# Patient Record
Sex: Male | Born: 1955 | Race: Asian | Hispanic: No | Marital: Married
Health system: Southern US, Community
[De-identification: ages and names within clinical notes are randomized; demographics above are authoritative.]

## PROBLEM LIST (undated history)

## (undated) DIAGNOSIS — I10 Essential (primary) hypertension: Secondary | ICD-10-CM

---

## 2020-01-02 ENCOUNTER — Emergency Department (HOSPITAL_COMMUNITY): Payer: Medicaid - Out of State

## 2020-01-02 ENCOUNTER — Other Ambulatory Visit: Payer: Self-pay

## 2020-01-02 ENCOUNTER — Encounter (HOSPITAL_COMMUNITY): Payer: Self-pay | Admitting: Emergency Medicine

## 2020-01-02 ENCOUNTER — Emergency Department (HOSPITAL_COMMUNITY)
Admission: EM | Admit: 2020-01-02 | Discharge: 2020-01-02 | Disposition: A | Discharge status: Home / Self Care | Payer: Medicaid - Out of State | Attending: Emergency Medicine | Admitting: Emergency Medicine

## 2020-01-02 DIAGNOSIS — Y929 Unspecified place or not applicable: Secondary | ICD-10-CM | POA: Insufficient documentation

## 2020-01-02 DIAGNOSIS — R42 Dizziness and giddiness: Secondary | ICD-10-CM | POA: Insufficient documentation

## 2020-01-02 DIAGNOSIS — W19XXXA Unspecified fall, initial encounter: Secondary | ICD-10-CM | POA: Diagnosis not present

## 2020-01-02 DIAGNOSIS — I10 Essential (primary) hypertension: Secondary | ICD-10-CM | POA: Insufficient documentation

## 2020-01-02 DIAGNOSIS — Y999 Unspecified external cause status: Secondary | ICD-10-CM | POA: Diagnosis not present

## 2020-01-02 DIAGNOSIS — H81399 Other peripheral vertigo, unspecified ear: Secondary | ICD-10-CM

## 2020-01-02 DIAGNOSIS — Y939 Activity, unspecified: Secondary | ICD-10-CM | POA: Insufficient documentation

## 2020-01-02 DIAGNOSIS — G6289 Other specified polyneuropathies: Secondary | ICD-10-CM

## 2020-01-02 HISTORY — DX: Essential (primary) hypertension: I10

## 2020-01-02 LAB — BASIC METABOLIC PANEL
Anion gap: 10 (ref 5–15)
BUN: 20 mg/dL (ref 8–23)
CO2: 25 mmol/L (ref 22–32)
Calcium: 9.5 mg/dL (ref 8.9–10.3)
Chloride: 103 mmol/L (ref 98–111)
Creatinine, Ser: 1.4 mg/dL — ABNORMAL HIGH (ref 0.61–1.24)
GFR calc Af Amer: >60 mL/min (ref >60)
GFR calc non Af Amer: 53 mL/min — ABNORMAL LOW (ref >60)
Glucose, Bld: 125 mg/dL — ABNORMAL HIGH (ref 70–99)
Potassium: 4.2 mmol/L (ref 3.5–5.1)
Sodium: 138 mmol/L (ref 135–145)

## 2020-01-02 LAB — CBC WITH DIFFERENTIAL/PLATELET
Abs Immature Granulocytes: 0.03 10*3/uL (ref 0.00–0.07)
Basophils Absolute: 0 10*3/uL (ref 0.0–0.1)
Basophils Relative: 0 %
Eosinophils Absolute: 0 10*3/uL (ref 0.0–0.5)
Eosinophils Relative: 0 %
HCT: 46.7 % (ref 39.0–52.0)
Hemoglobin: 15.9 g/dL (ref 13.0–17.0)
Immature Granulocytes: 0 %
Lymphocytes Relative: 10 %
Lymphs Abs: 1.1 10*3/uL (ref 0.7–4.0)
MCH: 31.2 pg (ref 26.0–34.0)
MCHC: 34 g/dL (ref 30.0–36.0)
MCV: 91.7 fL (ref 80.0–100.0)
Monocytes Absolute: 0.5 10*3/uL (ref 0.1–1.0)
Monocytes Relative: 5 %
Neutro Abs: 9 10*3/uL — ABNORMAL HIGH (ref 1.7–7.7)
Neutrophils Relative %: 85 %
Platelets: 185 10*3/uL (ref 150–400)
RBC: 5.09 MIL/uL (ref 4.22–5.81)
RDW: 12 % (ref 11.5–15.5)
WBC: 10.6 10*3/uL — ABNORMAL HIGH (ref 4.0–10.5)
nRBC: 0 % (ref 0.0–0.2)

## 2020-01-02 MED ORDER — FENTANYL CITRATE (PF) 100 MCG/2ML IJ SOLN
50.0000 ug | Freq: Once | INTRAMUSCULAR | Status: AC
  Administered 2020-01-02: 50 ug via INTRAVENOUS
  Filled 2020-01-02: qty 2

## 2020-01-02 MED ORDER — DIAZEPAM 5 MG PO TABS: 5.0000 mg | ORAL_TABLET | Freq: Three times a day (TID) | ORAL | 0 refills | Status: AC | PRN

## 2020-01-02 MED ORDER — ONDANSETRON HCL 4 MG/2ML IJ SOLN
4.0000 mg | Freq: Once | INTRAMUSCULAR | Status: AC
  Administered 2020-01-02: 4 mg via INTRAVENOUS
  Filled 2020-01-02: qty 2

## 2020-01-02 MED ORDER — LORAZEPAM 2 MG/ML IJ SOLN
1.0000 mg | Freq: Once | INTRAMUSCULAR | Status: AC
  Administered 2020-01-02: 1 mg via INTRAVENOUS
  Filled 2020-01-02: qty 1

## 2020-01-02 MED ORDER — MECLIZINE HCL 25 MG PO TABS
50.0000 mg | ORAL_TABLET | Freq: Once | ORAL | Status: AC
  Administered 2020-01-02: 50 mg via ORAL
  Filled 2020-01-02: qty 2

## 2020-01-02 MED ORDER — IOHEXOL 350 MG/ML SOLN
75.0000 mL | Freq: Once | INTRAVENOUS | Status: AC | PRN
  Administered 2020-01-02: 75 mL via INTRAVENOUS

## 2020-01-02 MED ORDER — DIAZEPAM 5 MG PO TABS
5.0000 mg | ORAL_TABLET | Freq: Once | ORAL | Status: AC
  Administered 2020-01-02: 5 mg via ORAL
  Filled 2020-01-02: qty 1

## 2020-01-02 NOTE — ED Notes (Signed)
Pt transported to CT ?

## 2020-01-02 NOTE — ED Provider Notes (Signed)
[facility]PART[facility] Provider Note   CSN: [id number] Arrival date & time: 01/02/20  0115     History Chief Complaint  Patient presents with  . Fall    [address] [place] is a 64 y.o. male.  HPI    64 year old male comes in a chief complaint of fall. Patient has history of hypertension.  He reports that he had woken up in the middle night to go to the bathroom, got dizzy and fell down.  Patient went down about 20 stairs.  Immediately after the fall he could not move his right side.  Subsequently he was able to move his right side but he started having numbness and tingling in his bilateral upper extremities.  Patient is complaining of headache, neck pain, right shoulder pain.  He denies any chest pain, shortness of breath or abdominal pain.  Patient is not on any blood thinners.  He denies any weakness in his lower extremity or upper extremities.  Past Medical History:  Diagnosis Date  . Hypertension     There are no problems to display for this patient.    No family history on file.  Social History   Tobacco Use  . Smoking status: Not on file  Substance Use Topics  . Alcohol use: Not on file  . Drug use: Not on file    Home Medications Prior to Admission medications   Medication Sig Start Date End Date Taking? Authorizing Provider  diazepam (VALIUM) 5 MG tablet Take 1 tablet (5 mg total) by mouth every 8 (eight) hours as needed (dizziness). 01/02/20   Blanchie Dessert, MD    Allergies    Patient has no known allergies.  Review of Systems   Review of Systems  Constitutional: Positive for activity change.  Respiratory: Negative for shortness of breath.   Cardiovascular: Negative for chest pain.  Gastrointestinal: Positive for nausea. Negative for vomiting.  Neurological: Positive for dizziness and numbness.  Hematological: Does not bruise/bleed easily.  All other systems reviewed and are negative.   Physical Exam Updated Vital  Signs BP 115/87   Pulse 60   Temp 97.8 F (36.6 C) (Oral)   Resp 20   SpO2 100%   Physical Exam Vitals and nursing note reviewed.  Constitutional:      Appearance: He is well-developed.  HENT:     Head: Atraumatic.  Eyes:     Extraocular Movements: Extraocular movements intact.     Pupils: Pupils are equal, round, and reactive to light.  Neck:     Comments: Patient has tenderness over the C-spine. Cardiovascular:     Rate and Rhythm: Normal rate.  Pulmonary:     Effort: Pulmonary effort is normal.  Musculoskeletal:     Cervical back: Neck supple.  Skin:    General: Skin is warm.  Neurological:     Mental Status: He is alert and oriented to person, place, and time.     Comments: Subjective numbness over the bilateral upper extremities. Patient has 1+ bicipital and patellar reflex bilaterally. Gross sensory exam of the lower extremities normal. Strength for upper and lower extremities 4+ out of 5 and equal bilaterally. No nystagmus.     ED Results / Procedures / Treatments   Labs (all labs ordered are listed, but only abnormal results are displayed) Labs Reviewed  BASIC METABOLIC PANEL - Abnormal; Notable for the following components:      Result Value   Glucose, Bld 125 (*)    Creatinine, Ser  1.40 (*)    GFR calc non Af Amer 53 (*)    All other components within normal limits  CBC WITH DIFFERENTIAL/PLATELET - Abnormal; Notable for the following components:   WBC 10.6 (*)    Neutro Abs 9.0 (*)    All other components within normal limits    EKG None  Radiology CT Angio Head W or Wo Contrast  Result Date: 01/02/2020 CLINICAL DATA:  Peripheral vertigo.  Fell down stairs. EXAM: CT ANGIOGRAPHY HEAD AND NECK TECHNIQUE: Multidetector CT imaging of the head and neck was performed using the standard protocol during bolus administration of intravenous contrast. Multiplanar CT image reconstructions and MIPs were obtained to evaluate the vascular anatomy. Carotid  stenosis measurements (when applicable) are obtained utilizing NASCET criteria, using the distal internal carotid diameter as the denominator. CONTRAST:  44mL OMNIPAQUE IOHEXOL 350 MG/ML SOLN COMPARISON:  CT head 01/02/2020. FINDINGS: CTA NECK FINDINGS Aortic arch: Standard branching. Imaged portion shows no evidence of aneurysm or dissection. No significant stenosis of the major arch vessel origins. Right carotid system: Normal right carotid. Negative for stenosis or dissection Left carotid system: Normal left carotid. Negative for stenosis or dissection Vertebral arteries: Vertebral arteries are codominant and normal bilaterally. Skeleton: Mild cervical spondylosis. No acute skeletal abnormality. Periapical lucency around right upper molar. Other neck: Negative for mass or adenopathy. Upper chest: Mild scarring left upper lobe. Remaining lung apices clear. Review of the MIP images confirms the above findings CTA HEAD FINDINGS Anterior circulation: Cavernous carotid widely patent bilaterally. Anterior and middle cerebral arteries patent bilaterally without stenosis. No large vessel occlusion or aneurysm. Posterior circulation: Both vertebral arteries widely patent to the basilar. Left PICA patent. Right PICA not visualized. Bilateral AICA patent. Basilar widely patent. Superior cerebellar and posterior cerebral arteries widely patent without stenosis or large vessel occlusion. Venous sinuses: Normal venous enhancement. Anatomic variants: None Review of the MIP images confirms the above findings IMPRESSION: 1. Normal CTA head and neck.  No stenosis or occlusion. Electronically Signed   By: Marlan Palau M.D.   On: 01/02/2020 09:43   DG Chest 2 View  Result Date: 01/02/2020 CLINICAL DATA:  Pt c/o head pain and arm "pinching." pt reports falling down more than 20 stairs. Pt reports slip and fall. No blood thinners, +LOC EXAM: CHEST - 2 VIEW COMPARISON:  None. FINDINGS: The cardiac silhouette is normal in size. No  mediastinal or hilar masses. No evidence of adenopathy. Clear lungs.  No pleural effusion or pneumothorax. Skeletal structures are intact. IMPRESSION: No active cardiopulmonary disease. Electronically Signed   By: Amie Portland M.D.   On: 01/02/2020 08:07   DG Shoulder Right  Result Date: 01/02/2020 CLINICAL DATA:  Pt c/o head pain and arm "pinching." pt reports falling down more than 20 stairs. Pt reports slip and fall. No blood thinners, +LOC EXAM: RIGHT SHOULDER - 2+ VIEW COMPARISON:  None. FINDINGS: No fracture or bone lesion. Mild narrowing of the glenohumeral and AC joints with small marginal osteophytes. Small focus of calcification adjacent to the greater tuberosity of the proximal humerus consistent with rotator cuff calcific tendinitis. Soft tissues are otherwise unremarkable. IMPRESSION: 1. No fracture or acute finding. 2. Mild degenerative/arthropathic changes of the glenohumeral and AC joints. Evidence of rotator cuff calcific tendinitis. Electronically Signed   By: Amie Portland M.D.   On: 01/02/2020 08:06   CT Head Wo Contrast  Result Date: 01/02/2020 CLINICAL DATA:  Fall down 20+ stairs, no blood thinners, reported loss of consciousness, right scalp  abrasions EXAM: CT HEAD WITHOUT CONTRAST CT CERVICAL SPINE WITHOUT CONTRAST TECHNIQUE: Multidetector CT imaging of the head and cervical spine was performed following the standard protocol without intravenous contrast. Multiplanar CT image reconstructions of the cervical spine were also generated. COMPARISON:  None. FINDINGS: CT HEAD FINDINGS Brain: No evidence of acute infarction, hemorrhage, hydrocephalus, extra-axial collection or mass lesion/mass effect. Symmetric prominence of the ventricles, cisterns and sulci compatible with parenchymal volume loss. Patchy areas of white matter hypoattenuation are most compatible with chronic microvascular angiopathy. Vascular: Atherosclerotic calcification of the carotid siphons. No hyperdense vessel.  Skull: Right parietal scalp swelling and hematoma measuring up to 7 mm in maximal thickness. No subjacent calvarial fracture. No soft tissue gas or foreign body. No other significant scalp sites of scalp swelling or thickening. No worrisome or suspicious osseous lesions. Sinuses/Orbits: Hyperostotic changes of the right maxillary sinus with some mild thickening likely reflecting chronic sinusitis. Remaining paranasal sinuses are predominantly clear. Few opacified left mastoid air cells posteriorly also with hyperostosis suggesting chronicity. No right mastoid effusions. Middle ear cavities are clear. Included orbital structures are unremarkable. Other: None CT CERVICAL SPINE FINDINGS Alignment: Stabilization collar is absent at the time of examination. There is mild straightening of the normal cervical lordosis with slight reversal at the upper lobe cervical levels apex C3-4. No evidence of traumatic listhesis. No abnormally widened, perched or jumped facets. Normal alignment of the craniocervical and atlantoaxial articulations accounting for mild rightward cranial rotation. Skull base and vertebrae: No visible skull base fracture. No vertebral body fracture or height loss is seen. Arthrosis at the atlantodental interval and basion dens interval. Mild diffuse spondylitic changes with uncinate spurring and mild facet hypertrophy. Bone island in the posterior right first rib. No acute or worrisome osseous lesions. Soft tissues and spinal canal: No pre or paravertebral fluid or swelling. No visible canal hematoma. Few calcified tonsilloliths. Disc levels: Multilevel cervical spondylitic changes, as detailed above. Posterior disc osteophyte complexes are noted throughout the cervical spine largely eccentric to the left central/subarticular zones. Multilevel mild canal stenoses are present C2-C7. Uncinate spurring and facet degenerative changes also result in multilevel mild-to-moderate foraminal narrowing with more  severe narrowing on the left at C3-4. Upper chest: No acute abnormality in the upper chest or imaged lung apices. Other: No concerning thyroid nodules. Periapical lucencies in the visible dentition. IMPRESSION: 1. No acute intracranial abnormality. 2. Right parietal scalp swelling and hematoma measuring up to 7 mm in maximal thickness. No subjacent calvarial fracture. 3. Chronic right maxillary sinusitis and left mastoid effusion. 4. No acute cervical spine fracture or traumatic listhesis. 5. Multilevel cervical spondylitic and facet degenerative changes, as detailed above. 6. Periapical lucency of the visible dentition. Correlate with dental exam. Electronically Signed   By: Kreg Shropshire M.D.   On: 01/02/2020 03:42   CT Angio Neck W and/or Wo Contrast  Result Date: 01/02/2020 CLINICAL DATA:  Peripheral vertigo.  Fell down stairs. EXAM: CT ANGIOGRAPHY HEAD AND NECK TECHNIQUE: Multidetector CT imaging of the head and neck was performed using the standard protocol during bolus administration of intravenous contrast. Multiplanar CT image reconstructions and MIPs were obtained to evaluate the vascular anatomy. Carotid stenosis measurements (when applicable) are obtained utilizing NASCET criteria, using the distal internal carotid diameter as the denominator. CONTRAST:  73mL OMNIPAQUE IOHEXOL 350 MG/ML SOLN COMPARISON:  CT head 01/02/2020. FINDINGS: CTA NECK FINDINGS Aortic arch: Standard branching. Imaged portion shows no evidence of aneurysm or dissection. No significant stenosis of the major arch vessel  origins. Right carotid system: Normal right carotid. Negative for stenosis or dissection Left carotid system: Normal left carotid. Negative for stenosis or dissection Vertebral arteries: Vertebral arteries are codominant and normal bilaterally. Skeleton: Mild cervical spondylosis. No acute skeletal abnormality. Periapical lucency around right upper molar. Other neck: Negative for mass or adenopathy. Upper chest:  Mild scarring left upper lobe. Remaining lung apices clear. Review of the MIP images confirms the above findings CTA HEAD FINDINGS Anterior circulation: Cavernous carotid widely patent bilaterally. Anterior and middle cerebral arteries patent bilaterally without stenosis. No large vessel occlusion or aneurysm. Posterior circulation: Both vertebral arteries widely patent to the basilar. Left PICA patent. Right PICA not visualized. Bilateral AICA patent. Basilar widely patent. Superior cerebellar and posterior cerebral arteries widely patent without stenosis or large vessel occlusion. Venous sinuses: Normal venous enhancement. Anatomic variants: None Review of the MIP images confirms the above findings IMPRESSION: 1. Normal CTA head and neck.  No stenosis or occlusion. Electronically Signed   By: Marlan Palau M.D.   On: 01/02/2020 09:43   CT Cervical Spine Wo Contrast  Result Date: 01/02/2020 CLINICAL DATA:  Fall down 20+ stairs, no blood thinners, reported loss of consciousness, right scalp abrasions EXAM: CT HEAD WITHOUT CONTRAST CT CERVICAL SPINE WITHOUT CONTRAST TECHNIQUE: Multidetector CT imaging of the head and cervical spine was performed following the standard protocol without intravenous contrast. Multiplanar CT image reconstructions of the cervical spine were also generated. COMPARISON:  None. FINDINGS: CT HEAD FINDINGS Brain: No evidence of acute infarction, hemorrhage, hydrocephalus, extra-axial collection or mass lesion/mass effect. Symmetric prominence of the ventricles, cisterns and sulci compatible with parenchymal volume loss. Patchy areas of white matter hypoattenuation are most compatible with chronic microvascular angiopathy. Vascular: Atherosclerotic calcification of the carotid siphons. No hyperdense vessel. Skull: Right parietal scalp swelling and hematoma measuring up to 7 mm in maximal thickness. No subjacent calvarial fracture. No soft tissue gas or foreign body. No other significant  scalp sites of scalp swelling or thickening. No worrisome or suspicious osseous lesions. Sinuses/Orbits: Hyperostotic changes of the right maxillary sinus with some mild thickening likely reflecting chronic sinusitis. Remaining paranasal sinuses are predominantly clear. Few opacified left mastoid air cells posteriorly also with hyperostosis suggesting chronicity. No right mastoid effusions. Middle ear cavities are clear. Included orbital structures are unremarkable. Other: None CT CERVICAL SPINE FINDINGS Alignment: Stabilization collar is absent at the time of examination. There is mild straightening of the normal cervical lordosis with slight reversal at the upper lobe cervical levels apex C3-4. No evidence of traumatic listhesis. No abnormally widened, perched or jumped facets. Normal alignment of the craniocervical and atlantoaxial articulations accounting for mild rightward cranial rotation. Skull base and vertebrae: No visible skull base fracture. No vertebral body fracture or height loss is seen. Arthrosis at the atlantodental interval and basion dens interval. Mild diffuse spondylitic changes with uncinate spurring and mild facet hypertrophy. Bone island in the posterior right first rib. No acute or worrisome osseous lesions. Soft tissues and spinal canal: No pre or paravertebral fluid or swelling. No visible canal hematoma. Few calcified tonsilloliths. Disc levels: Multilevel cervical spondylitic changes, as detailed above. Posterior disc osteophyte complexes are noted throughout the cervical spine largely eccentric to the left central/subarticular zones. Multilevel mild canal stenoses are present C2-C7. Uncinate spurring and facet degenerative changes also result in multilevel mild-to-moderate foraminal narrowing with more severe narrowing on the left at C3-4. Upper chest: No acute abnormality in the upper chest or imaged lung apices. Other: No concerning thyroid  nodules. Periapical lucencies in the visible  dentition. IMPRESSION: 1. No acute intracranial abnormality. 2. Right parietal scalp swelling and hematoma measuring up to 7 mm in maximal thickness. No subjacent calvarial fracture. 3. Chronic right maxillary sinusitis and left mastoid effusion. 4. No acute cervical spine fracture or traumatic listhesis. 5. Multilevel cervical spondylitic and facet degenerative changes, as detailed above. 6. Periapical lucency of the visible dentition. Correlate with dental exam. Electronically Signed   By: Kreg ShropshirePrice  DeHay M.D.   On: 01/02/2020 03:42    Procedures Procedures (including critical care time)  Medications Ordered in ED Medications  meclizine (ANTIVERT) tablet 50 mg (50 mg Oral Given 01/02/20 0717)  LORazepam (ATIVAN) injection 1 mg (1 mg Intravenous Given 01/02/20 0720)  ondansetron (ZOFRAN) injection 4 mg (4 mg Intravenous Given 01/02/20 0718)  fentaNYL (SUBLIMAZE) injection 50 mcg (50 mcg Intravenous Given 01/02/20 0723)  diazepam (VALIUM) tablet 5 mg (5 mg Oral Given 01/02/20 0818)  iohexol (OMNIPAQUE) 350 MG/ML injection 75 mL (75 mLs Intravenous Contrast Given 01/02/20 0901)    ED Course  I have reviewed the triage vital signs and the nursing notes.  Pertinent labs & imaging results that were available during my care of the patient were reviewed by me and considered in my medical decision making (see chart for details).    MDM Rules/Calculators/A&P                          64 year old male comes in a chief complaint of fall. He is hemodynamically stable.  He had gone down about 20 stairs.  Patient is not on any blood thinners.  He did have neurologic symptoms of bilateral upper extremity numbness, which started after a brief episode where patient could not move his right upper extremity.  His strength is now back to baseline.  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  Clinically does not appear that there is cord compression  based on exam.  CT head and C-spine ordered and they are reassuring.  C-spine CT scan is showing multiple areas of arthropathy and bone spurs, they are likely contributing to the symptoms he is having.  Patient will get neurosurgery follow-up in case his symptoms are not getting better.  We discussed strict ER return precautions and will return to the ER if he starts having worsening weakness, numbness.  Additionally patient is complaining of spinning sensation.  This also started after he had the fall.  Patient symptoms are worse with any kind of movement.  He has associated nausea.  CT head is negative.  Neuro exam is revealing no dysmetria or nystagmus.  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  We will give patient Ativan and meclizine.  Dr. Anitra LauthPlunkett to take over care at this time.  If patient is not getting better, then we will get a CT angiogram head and neck.  Final Clinical Impression(s) / ED Diagnoses Final diagnoses:  Fall, initial encounter  Peripheral vertigo, unspecified laterality    Rx / DC Orders ED Discharge Orders         Ordered    diazepam (VALIUM) 5 MG tablet  Every 8 hours PRN     Discontinue  Reprint     01/02/20 1043           Derwood KaplanNanavati, Kennette Cuthrell, MD 01/02/20 2311

## 2020-01-02 NOTE — Discharge Instructions (Signed)
All the imaging today shows no sign of internal injury from your fall.  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  You may have dizziness for the next few days and you can take the medication as needed but you do not have to take it regularly if you are not feeling dizzy.  Make sure if you are feeling unsteady walking someone is there to help you to ensure you do not fall.

## 2020-01-02 NOTE — ED Triage Notes (Addendum)
Pt presents to ED POV. Pt c/o head pain and arm "pinching." pt reports falling down more than 20 stairs. Pt reports slip and fall. No blood thinners, +LOC. Pt reports he was unable to move initially for 40m. Abrasion on R head.

## 2020-01-02 NOTE — ED Provider Notes (Signed)
Assumed care of patient from Dr. Rhunette Croft at 7:30 AM.  Patient had received meclizine and reported his dizziness was significantly better however when attempted to ambulate the patient he became extremely dizzy was slightly ataxic and having to hold onto the wall not to fall.  Given patient's recent fall down the stairs with the neck pain will ensure there is no vertebral dissection.  If that is normal and patient is still having dizziness with the addition of Valium he will need admission for symptom control.  10:10 AM CTA without evidence of vertebral dissection.  After Valium patient is now walking and feeling much better.  Feel it is reasonable that patient go home and given a prescription for Valium to take as needed.  We will have him follow-up with his doctor if dizziness does not resolve.   Gwyneth Sprout, MD 01/02/20 1043

## 2020-01-02 NOTE — ED Notes (Signed)
Patient verbalizes understanding of discharge instructions. Opportunity for questioning and answers were provided. Pt discharged from ED. 

## 2020-01-02 NOTE — ED Notes (Signed)
Dr. Clayborne Dana given verbal for CT head and c-spine

## 2020-01-02 NOTE — ED Notes (Signed)
Attempted to ambulate pt in hallway; pt ataxic, c/o dizziness; pt assisted back to bed; Dr. Anitra Lauth notified

## 2020-01-02 NOTE — ED Notes (Addendum)
Pt ambulatory in hall with steady gait; denies dizziness; Dr. Anitra Lauth aware

## 2021-05-15 IMAGING — CT CT CERVICAL SPINE W/O CM
3 of 4 series · 12 of 33 positions shown, 14 images · non-contrast
Comparison: None.

CLINICAL DATA: Fall down 20+ stairs, no blood thinners, reported
loss of consciousness, right scalp abrasions

EXAM:
CT HEAD WITHOUT CONTRAST
CT CERVICAL SPINE WITHOUT CONTRAST
TECHNIQUE: Multidetector CT imaging of the head and cervical spine was
performed following the standard protocol without intravenous
contrast. Multiplanar CT image reconstructions of the cervical spine
were also generated.

[Series 8: sag bone · sagittal · 0.30mm/px · 5 of 79 slices shown, 6 images]
[im 27/79  bone]
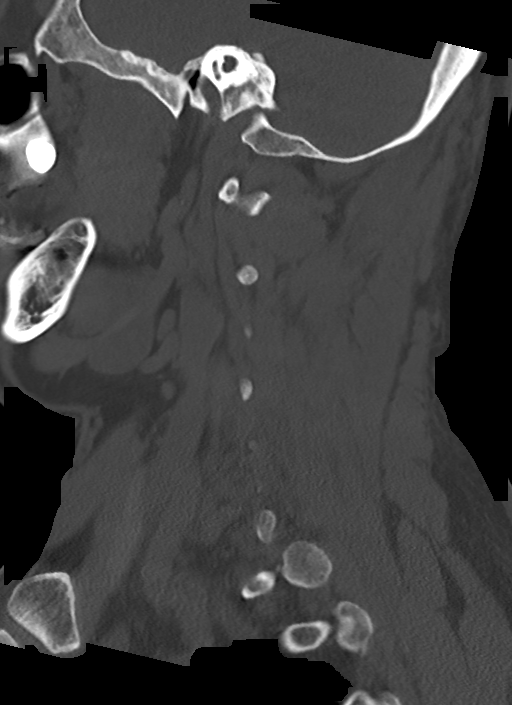
[im 33/79  bone]
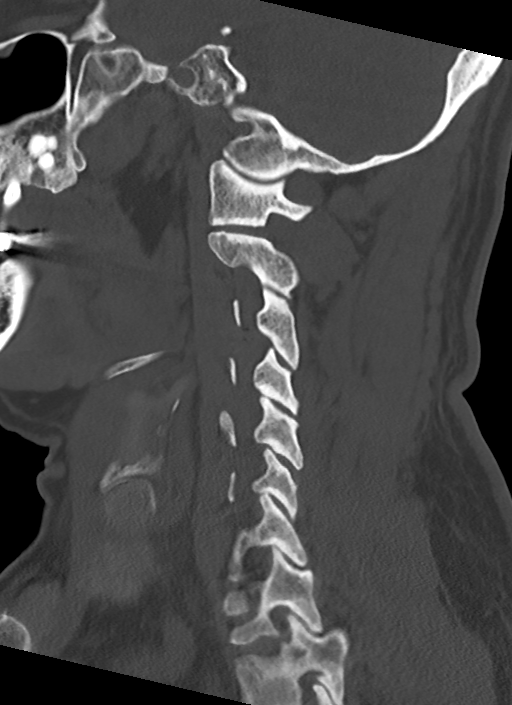
[im 40/79  soft-tissue]
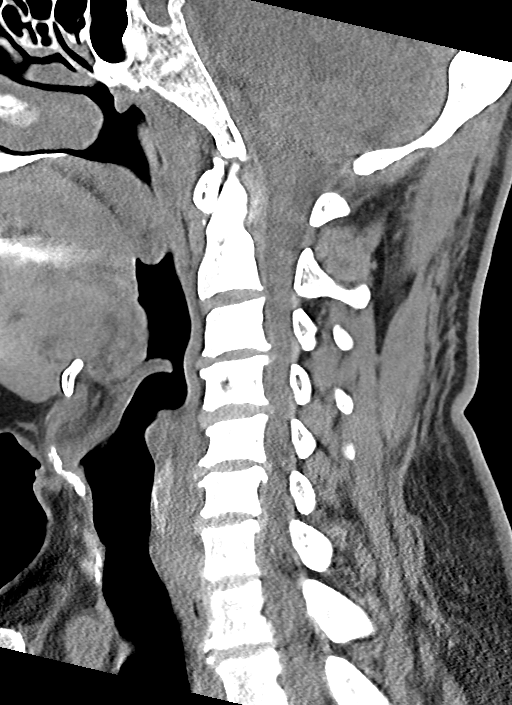
[im 40/79  bone]
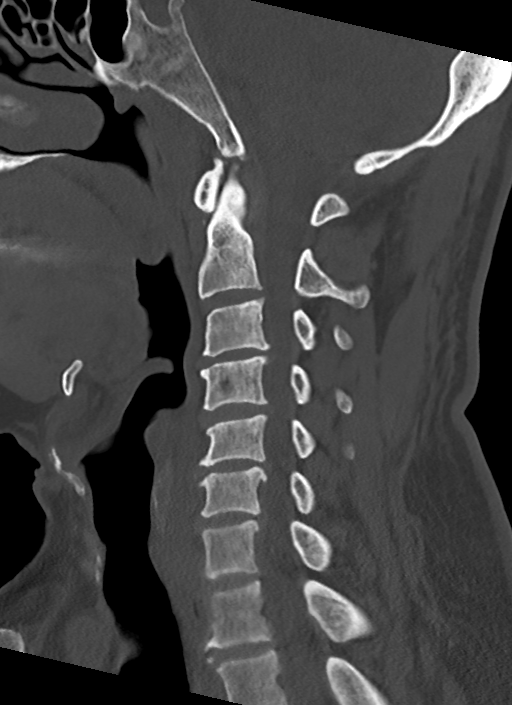
[im 46/79  bone]
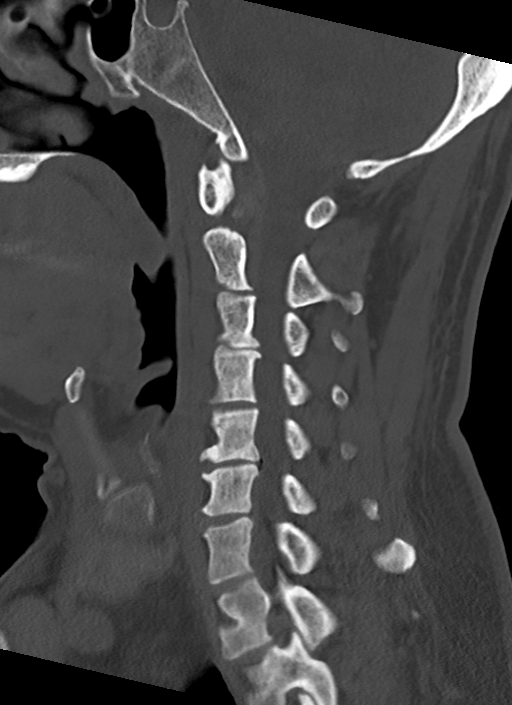
[im 53/79  bone]
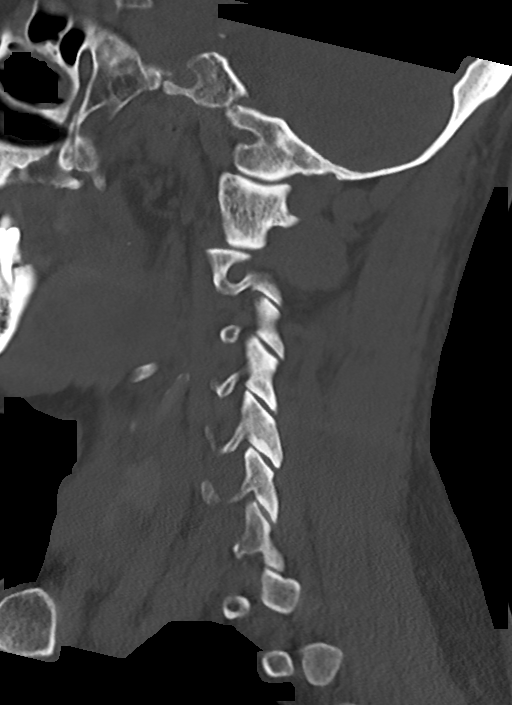

[Series 9: cor bone · coronal · 0.31mm/px · 3 of 77 slices shown]
[im 16/77  bone]
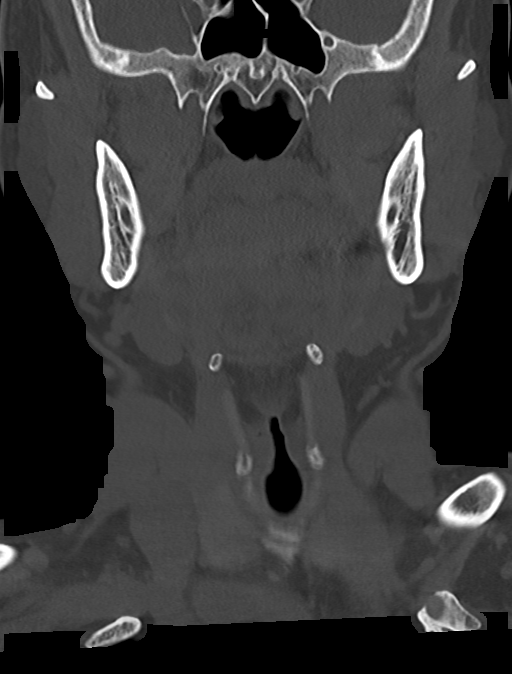
[im 31/77  bone]
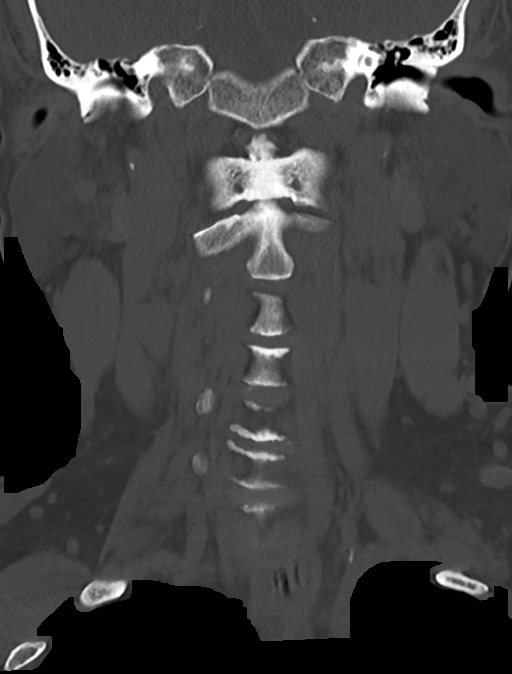
[im 46/77  bone]
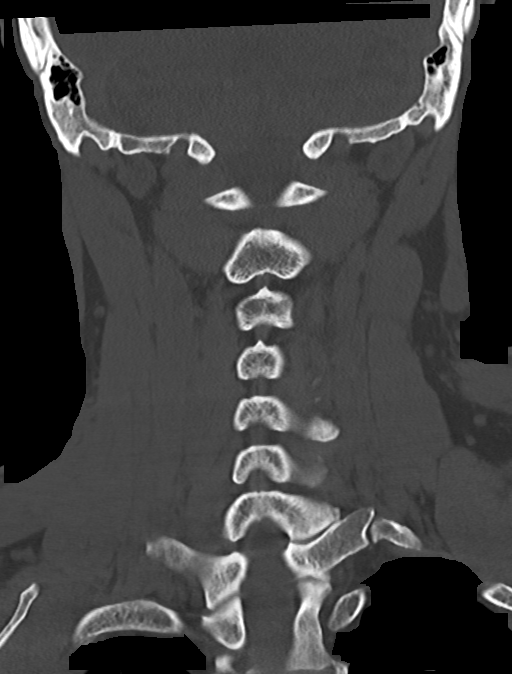

[Series 10: orthogonal axials · axial · 0.21mm/px · z∈[+1182,+1296]mm · 4 of 90 slices shown, 5 images]
[im 15/90  soft-tissue]
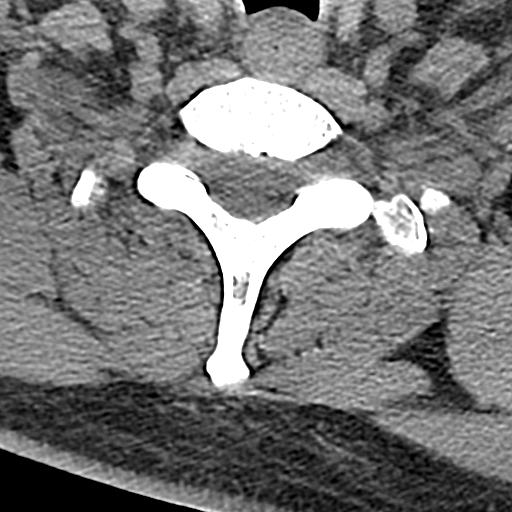
[im 15/90  bone]
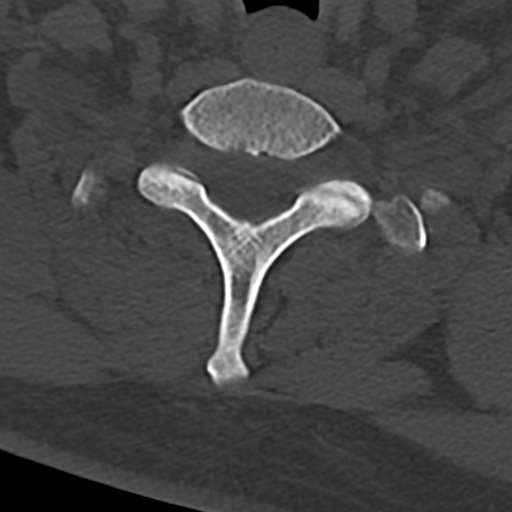
[im 30/90  bone]
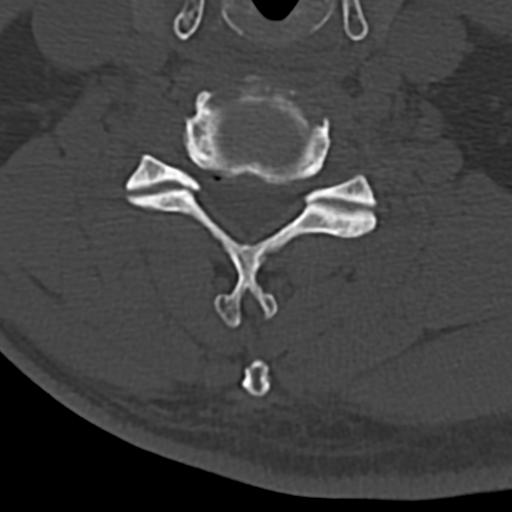
[im 60/90  bone]
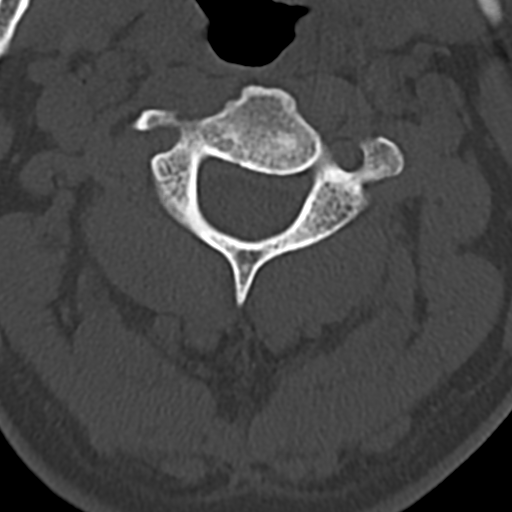
[im 75/90  bone]
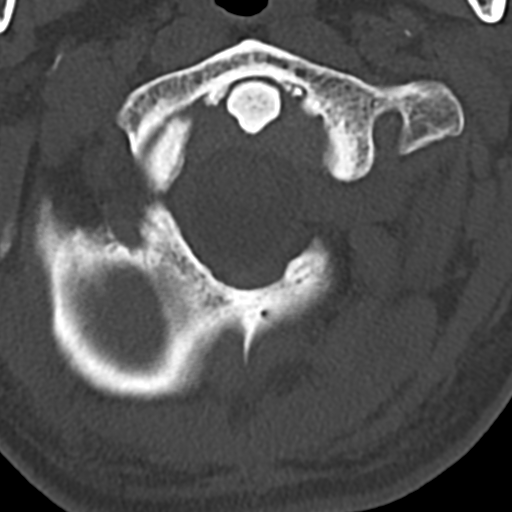

[12 of 33 positions shown; findings below may reference images not displayed]

FINDINGS: CT HEAD FINDINGS

Brain: No evidence of acute infarction, hemorrhage, hydrocephalus,
extra-axial collection or mass lesion/mass effect. Symmetric
prominence of the ventricles, cisterns and sulci compatible with
parenchymal volume loss. Patchy areas of white matter
hypoattenuation are most compatible with chronic microvascular
angiopathy.

Vascular: Atherosclerotic calcification of the carotid siphons. No
hyperdense vessel.

Skull: Right parietal scalp swelling and hematoma measuring up to 7
mm in maximal thickness. No subjacent calvarial fracture. No soft
tissue gas or foreign body. No other significant scalp sites of
scalp swelling or thickening. No worrisome or suspicious osseous
lesions.

Sinuses/Orbits: Hyperostotic changes of the right maxillary sinus
with some mild thickening likely reflecting chronic sinusitis.
Remaining paranasal sinuses are predominantly clear. Few opacified
left mastoid air cells posteriorly also with hyperostosis suggesting
chronicity. No right mastoid effusions. Middle ear cavities are
clear. Included orbital structures are unremarkable.

Other: None

CT CERVICAL SPINE FINDINGS

Alignment: Stabilization collar is absent at the time of
examination. There is mild straightening of the normal cervical
lordosis with slight reversal at the upper lobe cervical levels apex
C3-4. No evidence of traumatic listhesis. No abnormally widened,
perched or jumped facets. Normal alignment of the craniocervical and
atlantoaxial articulations accounting for mild rightward cranial
rotation.

Skull base and vertebrae: No visible skull base fracture. No
vertebral body fracture or height loss is seen. Arthrosis at the
atlantodental interval and basion dens interval. Mild diffuse
spondylitic changes with uncinate spurring and mild facet
hypertrophy. Bone island in the posterior right first rib. No acute
or worrisome osseous lesions.

Soft tissues and spinal canal: No pre or paravertebral fluid or
swelling. No visible canal hematoma. Few calcified tonsilloliths.

Disc levels: Multilevel cervical spondylitic changes, as detailed
above. Posterior disc osteophyte complexes are noted throughout the
cervical spine largely eccentric to the left central/subarticular
zones. Multilevel mild canal stenoses are present C2-C7. Uncinate
spurring and facet degenerative changes also result in multilevel
mild-to-moderate foraminal narrowing with more severe narrowing on
the left at C3-4.

Upper chest: No acute abnormality in the upper chest or imaged lung
apices.

Other: No concerning thyroid nodules. Periapical lucencies in the
visible dentition.
IMPRESSION: 1. No acute intracranial abnormality.
2. Right parietal scalp swelling and hematoma measuring up to 7 mm
in maximal thickness. No subjacent calvarial fracture.
3. Chronic right maxillary sinusitis and left mastoid effusion.
4. No acute cervical spine fracture or traumatic listhesis.
5. Multilevel cervical spondylitic and facet degenerative changes,
as detailed above.
6. Periapical lucency of the visible dentition. Correlate with
dental exam.

## 2021-05-15 IMAGING — CT CT HEAD W/O CM
4 series · 14 of 47 positions shown, 16 images · non-contrast
Comparison: None.

CLINICAL DATA: Fall down 20+ stairs, no blood thinners, reported
loss of consciousness, right scalp abrasions

EXAM:
CT HEAD WITHOUT CONTRAST
CT CERVICAL SPINE WITHOUT CONTRAST
TECHNIQUE: Multidetector CT imaging of the head and cervical spine was
performed following the standard protocol without intravenous
contrast. Multiplanar CT image reconstructions of the cervical spine
were also generated.

[Series 3: head wo · axial · 0.46mm/px · z∈[+1316,+1442]mm · 7 of 35 slices shown, 9 images]
[im 5/35  brain]
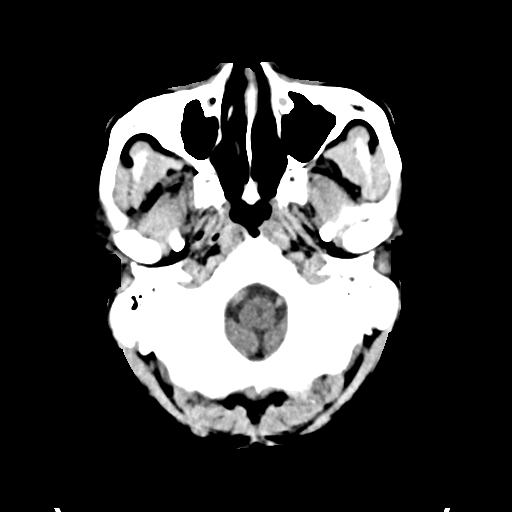
[im 5/35  bone]
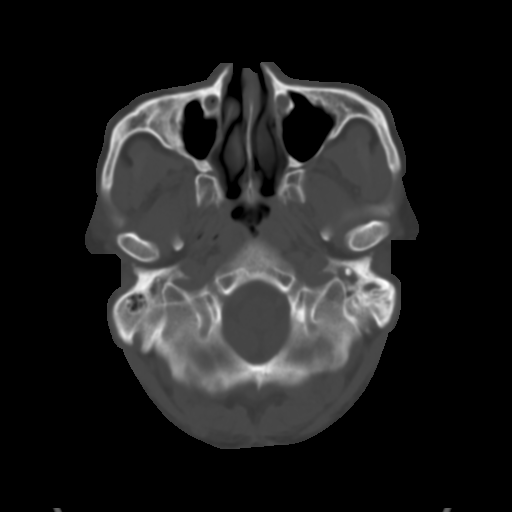
[im 9/35  brain]
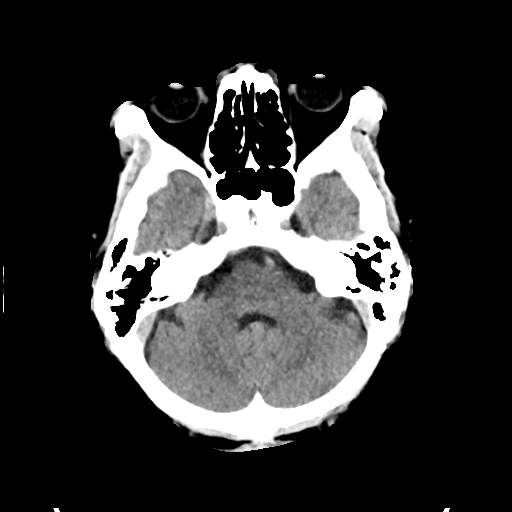
[im 13/35  brain]
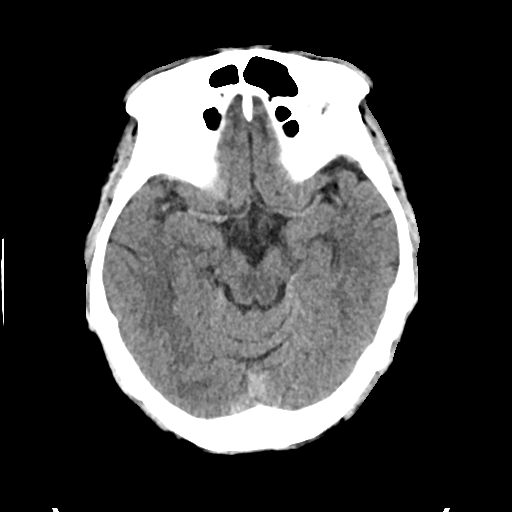
[im 18/35  brain]
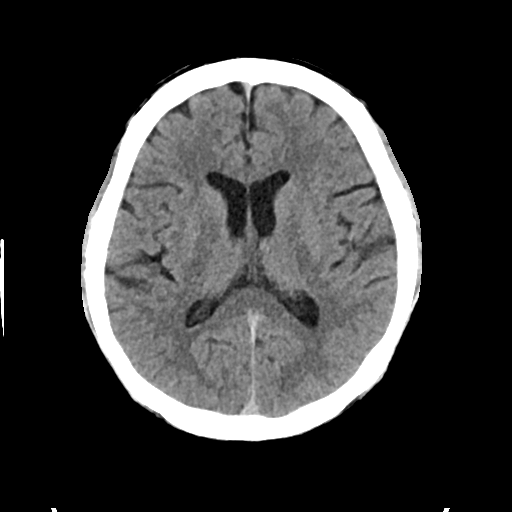
[im 22/35  brain]
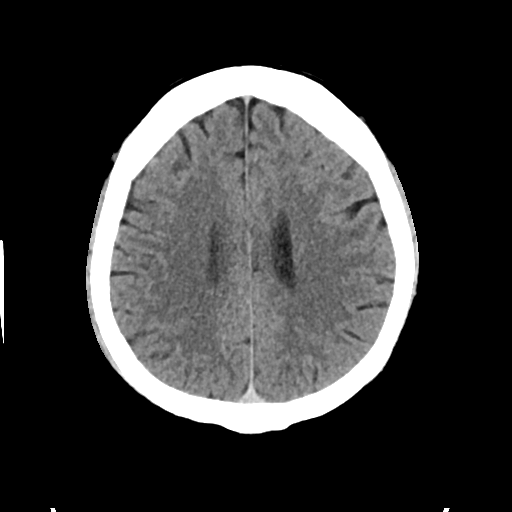
[im 22/35  bone]
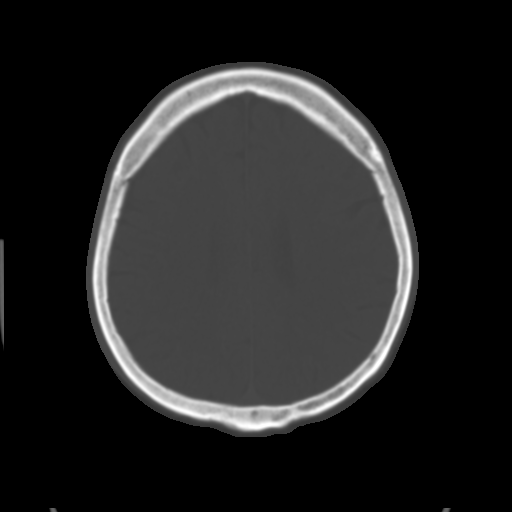
[im 26/35  brain]
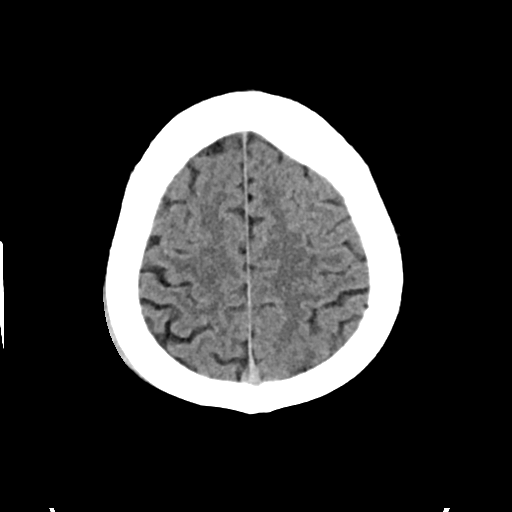
[im 30/35  brain]
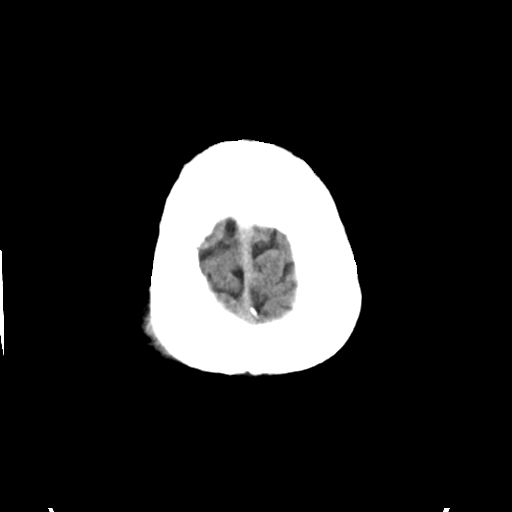

[Series 4: head bone · axial · 0.46mm/px · 1 of 86 slices shown]
[im 9/86  bone]
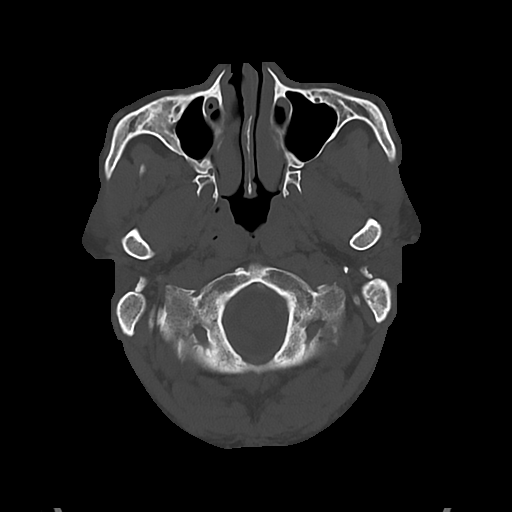

[Series 5: cor soft · coronal · 0.36mm/px · 3 of 69 slices shown]
[im 23/69  brain]
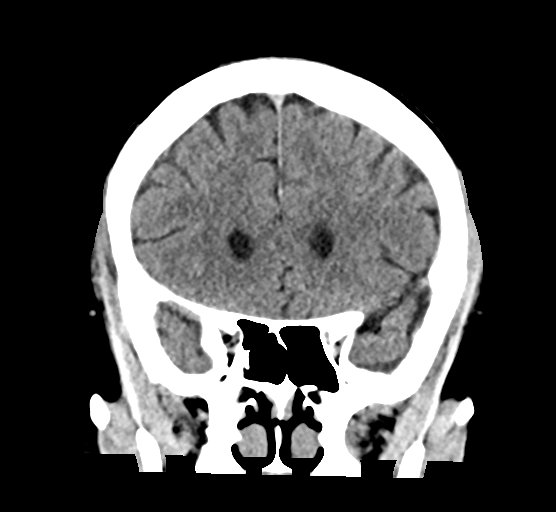
[im 31/69  brain]
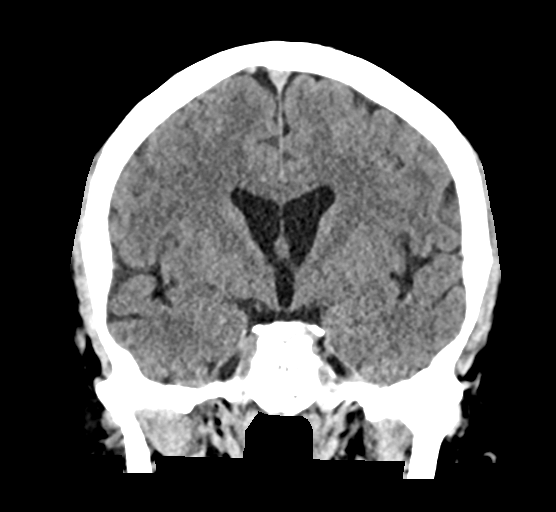
[im 38/69  brain]
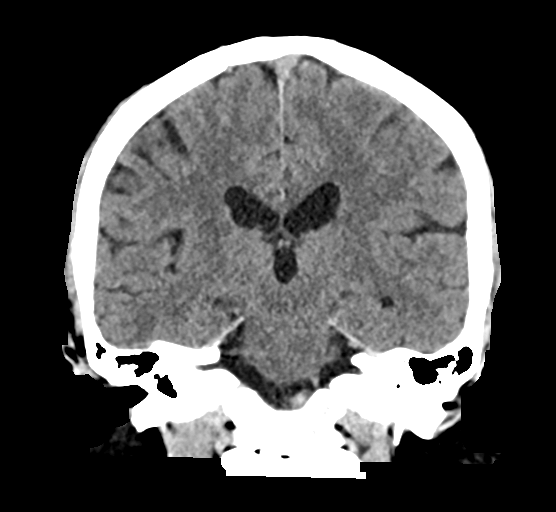

[Series 6: sag soft · sagittal · 0.34mm/px · 3 of 66 slices shown]
[im 22/66  brain]
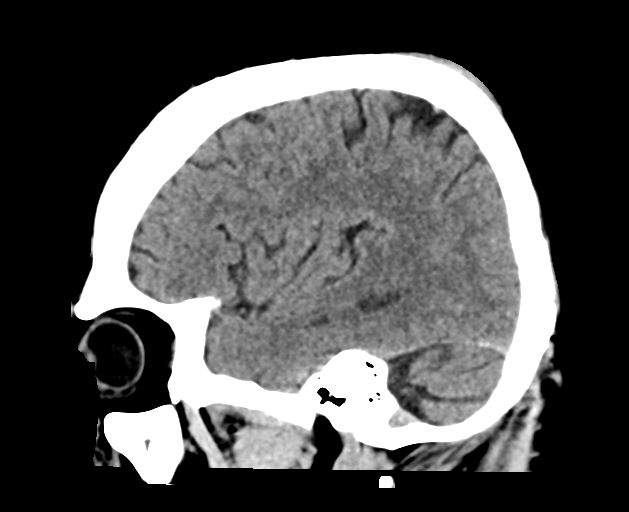
[im 33/66  brain]
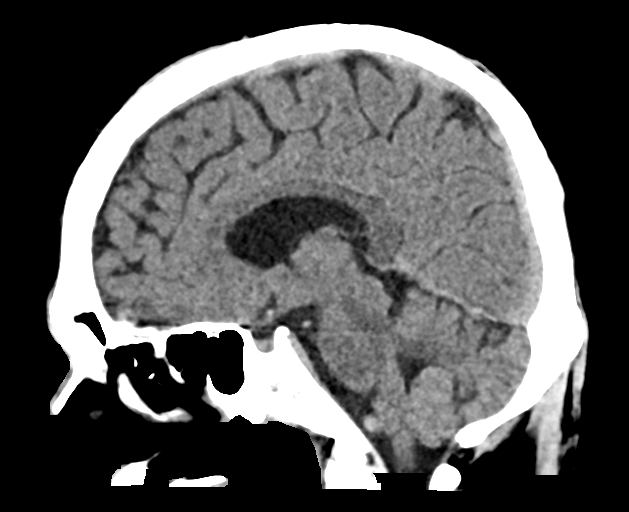
[im 44/66  brain]
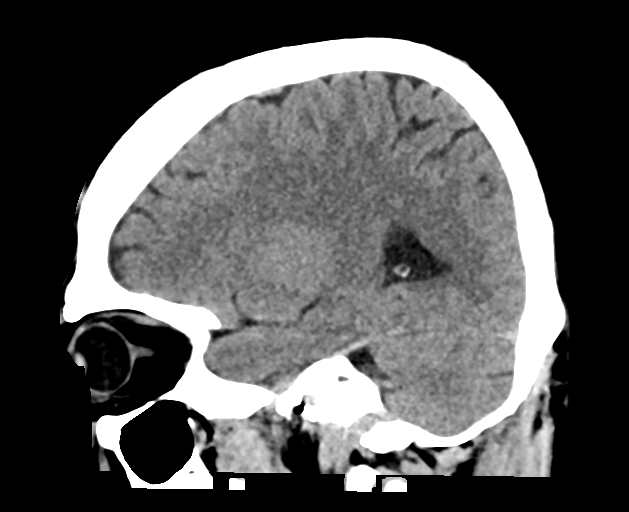

[14 of 47 positions shown; findings below may reference images not displayed]

FINDINGS: CT HEAD FINDINGS

Brain: No evidence of acute infarction, hemorrhage, hydrocephalus,
extra-axial collection or mass lesion/mass effect. Symmetric
prominence of the ventricles, cisterns and sulci compatible with
parenchymal volume loss. Patchy areas of white matter
hypoattenuation are most compatible with chronic microvascular
angiopathy.

Vascular: Atherosclerotic calcification of the carotid siphons. No
hyperdense vessel.

Skull: Right parietal scalp swelling and hematoma measuring up to 7
mm in maximal thickness. No subjacent calvarial fracture. No soft
tissue gas or foreign body. No other significant scalp sites of
scalp swelling or thickening. No worrisome or suspicious osseous
lesions.

Sinuses/Orbits: Hyperostotic changes of the right maxillary sinus
with some mild thickening likely reflecting chronic sinusitis.
Remaining paranasal sinuses are predominantly clear. Few opacified
left mastoid air cells posteriorly also with hyperostosis suggesting
chronicity. No right mastoid effusions. Middle ear cavities are
clear. Included orbital structures are unremarkable.

Other: None

CT CERVICAL SPINE FINDINGS

Alignment: Stabilization collar is absent at the time of
examination. There is mild straightening of the normal cervical
lordosis with slight reversal at the upper lobe cervical levels apex
C3-4. No evidence of traumatic listhesis. No abnormally widened,
perched or jumped facets. Normal alignment of the craniocervical and
atlantoaxial articulations accounting for mild rightward cranial
rotation.

Skull base and vertebrae: No visible skull base fracture. No
vertebral body fracture or height loss is seen. Arthrosis at the
atlantodental interval and basion dens interval. Mild diffuse
spondylitic changes with uncinate spurring and mild facet
hypertrophy. Bone island in the posterior right first rib. No acute
or worrisome osseous lesions.

Soft tissues and spinal canal: No pre or paravertebral fluid or
swelling. No visible canal hematoma. Few calcified tonsilloliths.

Disc levels: Multilevel cervical spondylitic changes, as detailed
above. Posterior disc osteophyte complexes are noted throughout the
cervical spine largely eccentric to the left central/subarticular
zones. Multilevel mild canal stenoses are present C2-C7. Uncinate
spurring and facet degenerative changes also result in multilevel
mild-to-moderate foraminal narrowing with more severe narrowing on
the left at C3-4.

Upper chest: No acute abnormality in the upper chest or imaged lung
apices.

Other: No concerning thyroid nodules. Periapical lucencies in the
visible dentition.
IMPRESSION: 1. No acute intracranial abnormality.
2. Right parietal scalp swelling and hematoma measuring up to 7 mm
in maximal thickness. No subjacent calvarial fracture.
3. Chronic right maxillary sinusitis and left mastoid effusion.
4. No acute cervical spine fracture or traumatic listhesis.
5. Multilevel cervical spondylitic and facet degenerative changes,
as detailed above.
6. Periapical lucency of the visible dentition. Correlate with
dental exam.
